# Patient Record
Sex: Male | Born: 2000 | Race: White | Hispanic: Yes | Marital: Single | Smoking: Never smoker
Health system: Southern US, Community
[De-identification: ages and names within clinical notes are randomized; demographics above are authoritative.]

## PROBLEM LIST (undated history)

## (undated) DIAGNOSIS — R7303 Prediabetes: Secondary | ICD-10-CM

## (undated) HISTORY — DX: Prediabetes: R73.03

---
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
## 2020-01-04 ENCOUNTER — Telehealth: Payer: Self-pay

## 2020-01-04 NOTE — Telephone Encounter (Signed)
Pre-screening for onsite visit  1. Who is bringing the patient to the visit? Self   Informed only one adult can bring patient to the visit to limit possible exposure to COVID19 and facemasks must be worn while in the building by the patient (ages 2 and older) and adult.  2. Has the person bringing the patient or the patient been around anyone with suspected or confirmed COVID-19 in the last 14 days? No   3. Has the person bringing the patient or the patient been around anyone who has been tested for COVID-19 in the last 14 days? no  4. Has the person bringing the patient or the patient had any of these symptoms in the last 14 days? No  Fever (temp 100 F or higher) Breathing problems Cough Sore throat Body aches Chills Vomiting Diarrhea Loss of taste or smell   If all answers are negative, advise patient to call our office prior to your appointment if you or the patient develop any of the symptoms listed above.   If any answers are yes, cancel in-office visit and schedule the patient for a same day telehealth visit with a provider to discuss the next steps.

## 2020-01-05 ENCOUNTER — Encounter: Payer: Self-pay | Admitting: Pediatrics

## 2020-01-05 ENCOUNTER — Other Ambulatory Visit (HOSPITAL_COMMUNITY)
Admission: RE | Admit: 2020-01-05 | Discharge: 2020-01-05 | Disposition: A | Discharge status: Home / Self Care | Payer: Medicaid Other | Source: Ambulatory Visit | Attending: Pediatrics | Admitting: Pediatrics

## 2020-01-05 ENCOUNTER — Other Ambulatory Visit: Payer: Self-pay

## 2020-01-05 ENCOUNTER — Ambulatory Visit (INDEPENDENT_AMBULATORY_CARE_PROVIDER_SITE_OTHER): Payer: Medicaid Other | Admitting: Pediatrics

## 2020-01-05 VITALS — BP 120/64 | HR 77 | Ht 69.69 in | Wt 220.8 lb

## 2020-01-05 DIAGNOSIS — Z6830 Body mass index (BMI) 30.0-30.9, adult: Secondary | ICD-10-CM | POA: Diagnosis not present

## 2020-01-05 DIAGNOSIS — Z113 Encounter for screening for infections with a predominantly sexual mode of transmission: Secondary | ICD-10-CM | POA: Diagnosis not present

## 2020-01-05 DIAGNOSIS — E661 Drug-induced obesity: Secondary | ICD-10-CM | POA: Diagnosis not present

## 2020-01-05 DIAGNOSIS — Z23 Encounter for immunization: Secondary | ICD-10-CM

## 2020-01-05 DIAGNOSIS — Z0001 Encounter for general adult medical examination with abnormal findings: Secondary | ICD-10-CM

## 2020-01-05 DIAGNOSIS — L309 Dermatitis, unspecified: Secondary | ICD-10-CM | POA: Diagnosis not present

## 2020-01-05 LAB — POCT RAPID HIV: Rapid HIV, POC: NEGATIVE

## 2020-01-05 MED ORDER — CLOBETASOL PROPIONATE 0.05 % EX OINT: 1.0000 "application " | TOPICAL_OINTMENT | Freq: Two times a day (BID) | CUTANEOUS | 5 refills | Status: DC

## 2020-01-05 NOTE — Patient Instructions (Signed)
As your medical provider, it is important to me that you continue to receive high-quality primary care services as you transition to adulthood.  After the age of 76, you can no longer be seen at the [facility] for your primary care health services.   Below is a list of adult medicine practices that are currently accepting new patients.    Adult Primary Care Clinics Name Criteria Services   [facility]  Address: [address] [place], [place] [address]  Phone: [phone] Hours: Monday - Friday 9 AM -6 PM  Types of insurance accepted:  Marland Kitchen Nurse, learning disability . [facility] (orange card) . Medicaid . Medicare . Uninsured  Language services:  Marland Kitchen Video and phone interpreters available   Ages 36 and older    . Adult primary care . Onsite pharmacy . Integrated behavioral health . Financial assistance counseling . Walk-in hours for established patients  Financial assistance counseling hours: Tuesdays 2:00PM - 5:00PM  Thursday 8:30AM - 4:30PM  Space is limited, 10 on Tuesday and 20 on Thursday on a first come, first serve basis  Name Criteria Services   [facility]  Address: [address] [place], [place] [address]  Phone: [phone]  Hours: Monday - Friday 8:30 AM - 5 PM  Types of insurance accepted:  Marland Kitchen Nurse, learning disability . Medicaid . Medicare . Uninsured  Language services:  Marland Kitchen Video and phone interpreters available   All ages - newborn to adult   . Primary care for all ages (children and adults) . Integrated behavioral health . Nutritionist . Financial assistance counseling   Name Criteria Services   [facility]  Located on the ground floor of [facility]  Address: [address]  [place],  [place]  [address]  Phone: [phone]  Hours: Monday - Friday 8:15 AM - 5 PM  Types of insurance  accepted:  Marland Kitchen Nurse, learning disability . Medicaid . Medicare . Uninsured  Language services:  Marland Kitchen Video and phone interpreters available   Ages 67 and older   . Adult primary care . Nutritionist . Certified Diabetes Educator  . Integrated behavioral health . Financial assistance counseling   Name Criteria Services   [facility]  Address: [address] [place], [place] [address]  Phone: [phone]  Hours: Monday - Friday 8:30 AM - 5 PM    Types of insurance accepted:  Marland Kitchen Nurse, learning disability . Medicaid . Medicare . Uninsured  Language services:  Marland Kitchen Video and phone interpreters available   All ages - newborn to adult   . Primary care for all ages (children and adults) . Integrated behavioral health . Financial assistance counseling

## 2020-01-05 NOTE — Progress Notes (Signed)
Adolescent Well Care Visit Allen Raymond is a 19 y.o. male who is here for well care.    PCP:  Clifton Custard, MD   History was provided by the patient.   Current Issues: Current concerns include eczema is flaring on his hands.  vaseline is helping a bit.  He ran out of his prescription cream which helped the eczema a lot.     Nutrition: Nutrition/Eating Behaviors: balanced diet, working on more fruits and veggies Adequate calcium in diet?: no Supplements/ Vitamins: no  Exercise/ Media: Play any Sports?/ Exercise: soccer - playing about 4 times per week, goalie usually. Screen Time:  > 2 hours-counseling provided  Sleep:  Sleep: staying up late, bedtime is 3-4 AM, wakes 7 AM for work, trying to fall asleep. No caffeine.   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  Wants to get GED - looking into online classes  Confidential Social History: Tobacco?  no Secondhand smoke exposure?  no Drugs/ETOH?  He reports smoking marijuana with friends a few times per month.  He says that it helps him relax and he likes it.  He is not thinking about stopping or reducing his use.  We discussed how using marijuana affects his health and his plans for his future.   Sexually Active?  yes - 1 male partner (his girlfriend)  Pregnancy Prevention: condoms  Screenings: Patient has a dental home: yes  The patient completed the Rapid Assessment for Adolescent Preventive Services screening questionnaire and the following topics were identified as risk factors and discussed:  Marijuana use.   In addition, the following topics were discussed as part of anticipatory guidance healthy eating, exercise, drug use, condom use and mental health issues.  PHQ-9 completed and results indicated no signs of depression  Physical Exam:   Vitals:   01/05/20 1437  BP: 120/64  Pulse: 77  SpO2: 97%  Weight: 220 lb 12.8 oz (100.2 kg)  Height: 5' 9.69" (1.77 m)   BP 120/64 (BP Location: Right Arm, Patient Position: Sitting, Cuff Size: Large)   Pulse 77   Ht 5' 9.69" (1.77 m)   Wt 220 lb 12.8 oz (100.2 kg)   SpO2 97%   BMI 31.97 kg/m  Body mass index: body mass index is 31.97 kg/m.    Hearing Screening   Method: Audiometry   125Hz  250Hz  500Hz  1000Hz  2000Hz  3000Hz  4000Hz  6000Hz  8000Hz   Right ear:   40 40 20  20    Left ear:   40 40 20  20      Visual Acuity Screening   Right eye Left eye Both eyes  Without correction: 20/20 20/20   With correction:       General Appearance:   alert, oriented, no acute distress and well nourished  HENT: Normocephalic, no obvious abnormality, conjunctiva clear  Mouth:   Normal appearing teeth, no obvious discoloration, dental caries, or dental caps  Neck:   Supple; thyroid: no enlargement, symmetric, no tenderness/mass/nodules  Chest Normal male  Lungs:   Clear to auscultation bilaterally, normal work of breathing  Heart:   Regular rate and rhythm, S1 and S2 normal, no murmurs;   Abdomen:   Soft, non-tender, no mass, or organomegaly  GU normal male genitals, no testicular masses or hernia, Tanner stage V, chaperone CMA) present during GU exam  Musculoskeletal:   Tone and strength  strong and symmetrical, all extremities               Lymphatic:   No cervical adenopathy  Skin/Hair/Nails:   no bruises or petechiae, rough dry patches with skin breakdown on the dorsum of hands and fingers.  No oozing or drainage  Neurologic:   Strength, gait, and coordination normal and age-appropriate     Assessment and Plan:   Encounter for general adult medical examination with abnormal findings  Routine screening for STI (sexually transmitted infection) - POCT Rapid HIV - negative - Urine cytology ancillary only  Eczema of hands No signs of superinfection.  Poorly controlled  without Rx.  Restart clobestasol prn. Discussed supportive care with hypoallergenic soap and regular application of bland emollients.  Reviewed appropriate use of steroid creams and return precautions. - clobetasol ointment (TEMOVATE) 0.05 %; Apply 1 application topically 2 (two) times daily. For very severe eczema.  Apply to affected area once a day until skin is soft and smooth.  Dispense: 30 g; Refill: 5  Hearing screening result:abnormal - denies hearing concerns at home.  Plan to recheck in 1 year Vision screening result: normal  Counseling provided for all of the vaccine components  Orders Placed This Encounter  Procedures  . Flu Vaccine QUAD 36+ mos IM     Return for 19 year old [facility] with Dr. Doneen Poisson in 1 year.Carmie End, MD

## 2020-01-08 ENCOUNTER — Encounter: Payer: Self-pay | Admitting: Pediatrics

## 2020-01-08 LAB — URINE CYTOLOGY ANCILLARY ONLY
Chlamydia: NEGATIVE
Comment: NEGATIVE
Comment: NORMAL
Neisseria Gonorrhea: NEGATIVE

## 2020-10-02 ENCOUNTER — Ambulatory Visit
Admission: RE | Admit: 2020-10-02 | Discharge: 2020-10-02 | Disposition: A | Discharge status: Home / Self Care | Payer: Medicaid Other | Source: Ambulatory Visit | Attending: Family Medicine | Admitting: Family Medicine

## 2020-10-02 ENCOUNTER — Ambulatory Visit (INDEPENDENT_AMBULATORY_CARE_PROVIDER_SITE_OTHER): Payer: Medicaid Other | Admitting: Family Medicine

## 2020-10-02 ENCOUNTER — Other Ambulatory Visit: Payer: Self-pay

## 2020-10-02 VITALS — BP 128/80 | Ht 71.0 in | Wt 220.0 lb

## 2020-10-02 DIAGNOSIS — M25561 Pain in right knee: Secondary | ICD-10-CM

## 2020-10-02 NOTE — Patient Instructions (Signed)
You have a medial meniscus tear in your knee. We will go ahead with an MRI to further assess - get x-rays after you leave today. If insurance denies this we'll put in an order for formal physical therapy. Ice the knee 15 minutes at a time 3-4 times a day. Knee brace when up and walking around. Aleve 1-2 tabs twice a day with food for pain and inflammation. Do quad strengthening exercises (knee extensions, straight leg raises) 3 sets of 10 once a day as we discussed. Follow up with me after the MRI for a no charge visit to go over results.

## 2020-10-02 NOTE — Progress Notes (Signed)
PCP: Clifton Custard, MD  Subjective:   HPI: Patient is a 18 y.o. male here for right knee pain for 1 month.  Patient reports that he was playing soccer approximately 1 month ago and was pushed from the left side.  His right foot was planted and he felt like his knee bent laterally.  He had immediate swelling and for the first 3 days was unable to walk on it.  He has since been improving but it has not been a month and he is still unable to play soccer and still having significant pain with swelling in his knee.  Patient reports that he has had meniscal tears in his right knee in the past and that he still has issues with feeling "popping" in his knees when he walks.  He has tried ice and heat with little relief.   Past Medical History:  Diagnosis Date  . Elevated blood pressure reading 12/06/2018  . Prediabetes 01/14/2016    Current Outpatient Medications on File Prior to Visit  Medication Sig Dispense Refill  . clobetasol ointment (TEMOVATE) 0.05 % Apply 1 application topically 2 (two) times daily. For very severe eczema.  Apply to affected area once a day until skin is soft and smooth. 30 g 5   No current facility-administered medications on file prior to visit.    No past surgical history on file.  No Known Allergies  Social History   Socioeconomic History  . Marital status: Single    Spouse name: Not on file  . Number of children: Not on file  . Years of education: Not on file  . Highest education level: Not on file  Occupational History  . Not on file  Tobacco Use  . Smoking status: Never Smoker  . Smokeless tobacco: Never Used  Substance and Sexual Activity  . Alcohol use: No  . Drug use: Not on file  . Sexual activity: Not on file  Other Topics Concern  . Not on file  Social History Narrative   Lives with parents, older brother and younger sister.  All are in school.  He is in the 7th grade.   Social Determinants of Health   Financial Resource Strain:   .  Difficulty of Paying Living Expenses: Not on file  Food Insecurity:   . Worried About Programme researcher, broadcasting/film/video in the Last Year: Not on file  . Ran Out of Food in the Last Year: Not on file  Transportation Needs:   . Lack of Transportation (Medical): Not on file  . Lack of Transportation (Non-Medical): Not on file  Physical Activity:   . Days of Exercise per Week: Not on file  . Minutes of Exercise per Session: Not on file  Stress:   . Feeling of Stress : Not on file  Social Connections:   . Frequency of Communication with Friends and Family: Not on file  . Frequency of Social Gatherings with Friends and Family: Not on file  . Attends Religious Services: Not on file  . Active Member of Clubs or Organizations: Not on file  . Attends Banker Meetings: Not on file  . Marital Status: Not on file  Intimate Partner Violence:   . Fear of Current or Ex-Partner: Not on file  . Emotionally Abused: Not on file  . Physically Abused: Not on file  . Sexually Abused: Not on file    Family History  Problem Relation Age of Onset  . Diabetes Maternal Grandmother  BP 128/80   Ht 5\' 11"  (1.803 m)   Wt 220 lb (99.8 kg)   BMI 30.68 kg/m   [facility] 10/02/2020  Frequency of aerobic exercise (# of days/week) 3  Average time in minutes 90  Frequency of strengthening activities (# of days/week) 0    No flowsheet data found.  Review of Systems: See HPI above.     Objective:  Physical Exam:  Gen: NAD, comfortable in exam room  Right knee exam: No gross deformity, ecchymoses.  Large joint effusion. Tenderness to palpation along the medial joint line as well as just medial to the patella. Lacks about 15 degrees of flexion.  Full extension. Negative ant/post drawers. Negative lachmans. Negative patellar apprehension.  Positive McMurray, apley, thessaly NV intact distally.  Assessment & Plan:  Right knee pain Patient with right knee pain for  approximately 1 month.  Injured knee while playing soccer.  Physical exam concerning for medial meniscal tear not improving over this time.  Difficulty ambulating due to pain.  Home exercises reviewed, knee brace -Knee x-ray ordered -MRI of the knee ordered -Knee brace provided -Recommended NSAIDs/Tylenol for pain relief -Follow-up after scans

## 2020-10-03 ENCOUNTER — Encounter: Payer: Self-pay | Admitting: Family Medicine

## 2020-10-05 ENCOUNTER — Ambulatory Visit
Admission: RE | Admit: 2020-10-05 | Discharge: 2020-10-05 | Disposition: A | Discharge status: Home / Self Care | Payer: Medicaid Other | Source: Ambulatory Visit | Attending: Family Medicine | Admitting: Family Medicine

## 2020-10-05 DIAGNOSIS — M25561 Pain in right knee: Secondary | ICD-10-CM | POA: Diagnosis not present

## 2020-10-11 DIAGNOSIS — M25561 Pain in right knee: Secondary | ICD-10-CM | POA: Diagnosis not present

## 2020-10-18 ENCOUNTER — Other Ambulatory Visit: Payer: Self-pay

## 2020-10-18 ENCOUNTER — Encounter (HOSPITAL_BASED_OUTPATIENT_CLINIC_OR_DEPARTMENT_OTHER): Payer: Self-pay | Admitting: Orthopaedic Surgery

## 2020-10-20 NOTE — H&P (Signed)
PREOPERATIVE H&P  Chief Complaint: RIGHT KNEE LATERAL MENISCUS TEAR  HPI: Allen Raymond is a 19 y.o. male who is scheduled for, Procedure(s): KNEE ARTHROSCOPY WITH LATERAL MENISECTOMY KNEE ARTHROSCOPY WITH MENISCAL REPAIR.   Patient is a healthy 19 year old who had an injury to his knee while he was playing soccer.  He had a twist knee injury with immediate pain and the inability to straighten his knee.  He has been unhappy with his knee since.  He was seen by Dr. Pearletha Forge and told he had a bucket handle lateral meniscus tear.    His symptoms are rated as moderate to severe, and have been worsening.  This is significantly impairing activities of daily living.    Please see clinic note for further details on this patient's care.    He has elected for surgical management.   Past Medical History:  Diagnosis Date  . Elevated blood pressure reading 12/06/2018  . Prediabetes 01/14/2016   History reviewed. No pertinent surgical history. Social History   Socioeconomic History  . Marital status: Single    Spouse name: Not on file  . Number of children: Not on file  . Years of education: Not on file  . Highest education level: Not on file  Occupational History  . Not on file  Tobacco Use  . Smoking status: Never Smoker  . Smokeless tobacco: Never Used  Vaping Use  . Vaping Use: Never used  Substance and Sexual Activity  . Alcohol use: No  . Drug use: Never  . Sexual activity: Yes  Other Topics Concern  . Not on file  Social History Narrative   Lives with parents, older brother and younger sister.  All are in school.  He is in the 7th grade.   Social Determinants of Health   Financial Resource Strain: Not on file  Food Insecurity: Not on file  Transportation Needs: Not on file  Physical Activity: Not on file  Stress: Not on file  Social Connections: Not on file   Family History  Problem Relation Age of Onset  . Diabetes Maternal Grandmother    No Known  Allergies Prior to Admission medications   Medication Sig Start Date End Date Taking? Authorizing Provider  clobetasol ointment (TEMOVATE) 0.05 % Apply 1 application topically 2 (two) times daily. For very severe eczema.  Apply to affected area once a day until skin is soft and smooth. 01/05/20  Yes Ettefagh, Aron Baba, MD    ROS: All other systems have been reviewed and were otherwise negative with the exception of those mentioned in the HPI and as above.  Physical Exam: General: Alert, no acute distress Cardiovascular: No pedal edema Respiratory: No cyanosis, no use of accessory musculature GI: No organomegaly, abdomen is soft and non-tender Skin: No lesions in the area of chief complaint Neurologic: Sensation intact distally Psychiatric: Patient is competent for consent with normal mood and affect Lymphatic: No axillary or cervical lymphadenopathy  MUSCULOSKELETAL:  Right knee: He is tender to palpation at the lateral joint line.    He has pain with deep flexion, and can only get to about 90 degrees of flexion.  He is ligamentously normal, and he is otherwise relatively normal.  Imaging: MRI is reviewed which demonstrates partial thickness lateral cartilage loss as well as a bucket handle lateral meniscus tear.  The ligaments appear to be intact.  Assessment: Lateral bucket handle locked meniscus tear.   Plan: Plan for Procedure(s): KNEE ARTHROSCOPY WITH LATERAL MENISECTOMY KNEE ARTHROSCOPY  WITH MENISCAL REPAIR  The risks benefits and alternatives were discussed with the patient including but not limited to the risks of nonoperative treatment, versus surgical intervention including infection, bleeding, nerve injury,  blood clots, cardiopulmonary complications, morbidity, mortality, among others, and they were willing to proceed.   The patient acknowledged the explanation, agreed to proceed with the plan and consent was signed.   Operative Plan: Right knee arthroscopy with  meniscectomy versus a repair Discharge Medications: Standard DVT Prophylaxis: Aspirin Physical Therapy: +/- Special Discharge needs: +/- knee immobilizer   Vernetta Honey, PA-C  10/20/2020 3:32 PM

## 2020-10-21 ENCOUNTER — Inpatient Hospital Stay (HOSPITAL_COMMUNITY): Admission: RE | Admit: 2020-10-21 | Payer: Medicaid Other | Source: Ambulatory Visit

## 2020-10-22 ENCOUNTER — Other Ambulatory Visit (HOSPITAL_COMMUNITY)
Admission: RE | Admit: 2020-10-22 | Discharge: 2020-10-22 | Disposition: A | Discharge status: Home / Self Care | Payer: Medicaid Other | Source: Ambulatory Visit | Attending: Orthopaedic Surgery | Admitting: Orthopaedic Surgery

## 2020-10-22 DIAGNOSIS — Z20822 Contact with and (suspected) exposure to covid-19: Secondary | ICD-10-CM | POA: Insufficient documentation

## 2020-10-22 DIAGNOSIS — Z01812 Encounter for preprocedural laboratory examination: Secondary | ICD-10-CM | POA: Diagnosis not present

## 2020-10-22 LAB — SARS CORONAVIRUS 2 (TAT 6-24 HRS): SARS Coronavirus 2: NEGATIVE

## 2020-10-24 ENCOUNTER — Ambulatory Visit (HOSPITAL_BASED_OUTPATIENT_CLINIC_OR_DEPARTMENT_OTHER)
Admission: RE | Admit: 2020-10-24 | Discharge: 2020-10-24 | Disposition: A | Discharge status: Home / Self Care | Payer: Medicaid Other | Attending: Orthopaedic Surgery | Admitting: Orthopaedic Surgery

## 2020-10-24 ENCOUNTER — Encounter (HOSPITAL_BASED_OUTPATIENT_CLINIC_OR_DEPARTMENT_OTHER)
Admission: RE | Disposition: A | Discharge status: Home / Self Care | Payer: Self-pay | Source: Home / Self Care | Attending: Orthopaedic Surgery

## 2020-10-24 ENCOUNTER — Other Ambulatory Visit: Payer: Self-pay

## 2020-10-24 ENCOUNTER — Ambulatory Visit (HOSPITAL_BASED_OUTPATIENT_CLINIC_OR_DEPARTMENT_OTHER): Payer: Medicaid Other | Admitting: Anesthesiology

## 2020-10-24 ENCOUNTER — Encounter (HOSPITAL_BASED_OUTPATIENT_CLINIC_OR_DEPARTMENT_OTHER): Payer: Self-pay | Admitting: Orthopaedic Surgery

## 2020-10-24 DIAGNOSIS — Y9389 Activity, other specified: Secondary | ICD-10-CM | POA: Diagnosis not present

## 2020-10-24 DIAGNOSIS — Y9366 Activity, soccer: Secondary | ICD-10-CM | POA: Diagnosis not present

## 2020-10-24 DIAGNOSIS — Z833 Family history of diabetes mellitus: Secondary | ICD-10-CM | POA: Diagnosis not present

## 2020-10-24 DIAGNOSIS — L700 Acne vulgaris: Secondary | ICD-10-CM | POA: Diagnosis not present

## 2020-10-24 DIAGNOSIS — S83281A Other tear of lateral meniscus, current injury, right knee, initial encounter: Secondary | ICD-10-CM | POA: Diagnosis not present

## 2020-10-24 DIAGNOSIS — S83271A Complex tear of lateral meniscus, current injury, right knee, initial encounter: Secondary | ICD-10-CM | POA: Diagnosis not present

## 2020-10-24 DIAGNOSIS — R7303 Prediabetes: Secondary | ICD-10-CM | POA: Diagnosis not present

## 2020-10-24 DIAGNOSIS — L309 Dermatitis, unspecified: Secondary | ICD-10-CM | POA: Diagnosis not present

## 2020-10-24 HISTORY — PX: KNEE ARTHROSCOPY WITH LATERAL MENISECTOMY: SHX6193

## 2020-10-24 SURGERY — ARTHROSCOPY, KNEE, WITH LATERAL MENISCECTOMY
Anesthesia: General | Site: Knee | Laterality: Right

## 2020-10-24 MED ORDER — BUPIVACAINE HCL (PF) 0.25 % IJ SOLN
INTRAMUSCULAR | Status: DC | PRN
  Administered 2020-10-24: 20 mL

## 2020-10-24 MED ORDER — OXYCODONE HCL 5 MG/5ML PO SOLN: 5.0000 mg | Freq: Once | ORAL | Status: AC | PRN

## 2020-10-24 MED ORDER — FENTANYL CITRATE (PF) 100 MCG/2ML IJ SOLN
INTRAMUSCULAR | Status: AC
  Filled 2020-10-24: qty 2

## 2020-10-24 MED ORDER — FENTANYL CITRATE (PF) 100 MCG/2ML IJ SOLN: 25.0000 ug | INTRAMUSCULAR | Status: DC | PRN

## 2020-10-24 MED ORDER — CEFAZOLIN SODIUM-DEXTROSE 2-4 GM/100ML-% IV SOLN
INTRAVENOUS | Status: AC
  Filled 2020-10-24: qty 100

## 2020-10-24 MED ORDER — ACETAMINOPHEN 500 MG PO TABS
ORAL_TABLET | ORAL | Status: AC
  Filled 2020-10-24: qty 2

## 2020-10-24 MED ORDER — ASPIRIN 81 MG PO CHEW: 81.0000 mg | CHEWABLE_TABLET | Freq: Two times a day (BID) | ORAL | 0 refills | Status: AC

## 2020-10-24 MED ORDER — FENTANYL CITRATE (PF) 100 MCG/2ML IJ SOLN
INTRAMUSCULAR | Status: DC | PRN
  Administered 2020-10-24: 50 ug via INTRAVENOUS
  Administered 2020-10-24 (×4): 25 ug via INTRAVENOUS

## 2020-10-24 MED ORDER — KETOROLAC TROMETHAMINE 30 MG/ML IJ SOLN
INTRAMUSCULAR | Status: DC | PRN
  Administered 2020-10-24: 30 mg via INTRAVENOUS

## 2020-10-24 MED ORDER — CEFAZOLIN SODIUM-DEXTROSE 2-4 GM/100ML-% IV SOLN
2.0000 g | INTRAVENOUS | Status: AC
  Administered 2020-10-24: 11:00:00 2 g via INTRAVENOUS

## 2020-10-24 MED ORDER — ONDANSETRON HCL 4 MG PO TABS: 4.0000 mg | ORAL_TABLET | Freq: Every day | ORAL | 0 refills | Status: DC | PRN

## 2020-10-24 MED ORDER — OXYCODONE HCL 5 MG PO TABS: 5.0000 mg | ORAL_TABLET | Freq: Four times a day (QID) | ORAL | 0 refills | Status: DC | PRN

## 2020-10-24 MED ORDER — LACTATED RINGERS IV SOLN: INTRAVENOUS | Status: DC

## 2020-10-24 MED ORDER — PROMETHAZINE HCL 25 MG/ML IJ SOLN: 6.2500 mg | INTRAMUSCULAR | Status: DC | PRN

## 2020-10-24 MED ORDER — MIDAZOLAM HCL 2 MG/2ML IJ SOLN
INTRAMUSCULAR | Status: AC
  Filled 2020-10-24: qty 2

## 2020-10-24 MED ORDER — OXYCODONE HCL 5 MG PO TABS
ORAL_TABLET | ORAL | Status: AC
  Filled 2020-10-24: qty 1

## 2020-10-24 MED ORDER — ACETAMINOPHEN 500 MG PO TABS: 1000.0000 mg | ORAL_TABLET | Freq: Three times a day (TID) | ORAL | 0 refills | Status: AC

## 2020-10-24 MED ORDER — ONDANSETRON HCL 4 MG/2ML IJ SOLN
INTRAMUSCULAR | Status: AC
  Filled 2020-10-24: qty 2

## 2020-10-24 MED ORDER — MELOXICAM 7.5 MG PO TABS: 7.5000 mg | ORAL_TABLET | Freq: Every day | ORAL | 0 refills | Status: AC

## 2020-10-24 MED ORDER — PROPOFOL 10 MG/ML IV BOLUS
INTRAVENOUS | Status: AC
  Filled 2020-10-24: qty 20

## 2020-10-24 MED ORDER — DEXAMETHASONE SODIUM PHOSPHATE 10 MG/ML IJ SOLN
INTRAMUSCULAR | Status: DC | PRN
  Administered 2020-10-24: 10 mg via INTRAVENOUS

## 2020-10-24 MED ORDER — LIDOCAINE 2% (20 MG/ML) 5 ML SYRINGE
INTRAMUSCULAR | Status: DC | PRN
  Administered 2020-10-24: 100 mg via INTRAVENOUS

## 2020-10-24 MED ORDER — ONDANSETRON HCL 4 MG/2ML IJ SOLN
INTRAMUSCULAR | Status: DC | PRN
  Administered 2020-10-24: 4 mg via INTRAVENOUS

## 2020-10-24 MED ORDER — PROPOFOL 10 MG/ML IV BOLUS
INTRAVENOUS | Status: DC | PRN
  Administered 2020-10-24: 200 mg via INTRAVENOUS

## 2020-10-24 MED ORDER — MIDAZOLAM HCL 5 MG/5ML IJ SOLN
INTRAMUSCULAR | Status: DC | PRN
  Administered 2020-10-24: 2 mg via INTRAVENOUS

## 2020-10-24 MED ORDER — ACETAMINOPHEN 500 MG PO TABS
1000.0000 mg | ORAL_TABLET | Freq: Once | ORAL | Status: AC
  Administered 2020-10-24: 10:00:00 1000 mg via ORAL

## 2020-10-24 MED ORDER — OXYCODONE HCL 5 MG PO TABS
5.0000 mg | ORAL_TABLET | Freq: Once | ORAL | Status: AC | PRN
  Administered 2020-10-24: 5 mg via ORAL

## 2020-10-24 SURGICAL SUPPLY — 45 items
BANDAGE ESMARK 6X9 LF (GAUZE/BANDAGES/DRESSINGS) IMPLANT
BLADE CLIPPER SURG (BLADE) IMPLANT
BLADE EXCALIBUR 4.0X13 (MISCELLANEOUS) ×2 IMPLANT
BLADE SHAVER BONE 5.0X13 (MISCELLANEOUS) IMPLANT
BNDG ELASTIC 6X5.8 VLCR STR LF (GAUZE/BANDAGES/DRESSINGS) ×2 IMPLANT
BNDG ESMARK 6X9 LF (GAUZE/BANDAGES/DRESSINGS)
BURR OVAL 8 FLU 4.0X13 (MISCELLANEOUS) IMPLANT
CHLORAPREP W/TINT 26 (MISCELLANEOUS) ×2 IMPLANT
CLSR STERI-STRIP ANTIMIC 1/2X4 (GAUZE/BANDAGES/DRESSINGS) ×2 IMPLANT
CUFF TOURN SGL QUICK 34 (TOURNIQUET CUFF) ×2
CUFF TRNQT CYL 34X4.125X (TOURNIQUET CUFF) ×1 IMPLANT
CUTTER TENSIONER SUT 2-0 0 FBW (INSTRUMENTS) IMPLANT
DISSECTOR 3.5MM X 13CM CVD (MISCELLANEOUS) IMPLANT
DISSECTOR 4.0MMX13CM CVD (MISCELLANEOUS) ×2 IMPLANT
DRAPE ARTHROSCOPY W/POUCH 90 (DRAPES) ×2 IMPLANT
DRAPE IMP U-DRAPE 54X76 (DRAPES) ×2 IMPLANT
DRAPE U-SHAPE 47X51 STRL (DRAPES) ×2 IMPLANT
FIBERSTICK 2 (SUTURE) IMPLANT
GAUZE SPONGE 4X4 12PLY STRL (GAUZE/BANDAGES/DRESSINGS) ×2 IMPLANT
GLOVE BIO SURGEON STRL SZ 6.5 (GLOVE) ×2 IMPLANT
GLOVE ECLIPSE 6.5 STRL STRAW (GLOVE) ×2 IMPLANT
GLOVE ECLIPSE 8.0 STRL XLNG CF (GLOVE) ×4 IMPLANT
GLOVE SRG 8 PF TXTR STRL LF DI (GLOVE) ×1 IMPLANT
GLOVE SURG UNDER POLY LF SZ6.5 (GLOVE) ×2 IMPLANT
GLOVE SURG UNDER POLY LF SZ7 (GLOVE) ×4 IMPLANT
GLOVE SURG UNDER POLY LF SZ8 (GLOVE) ×2
GOWN STRL REUS W/ TWL LRG LVL3 (GOWN DISPOSABLE) ×2 IMPLANT
GOWN STRL REUS W/TWL LRG LVL3 (GOWN DISPOSABLE) ×4
GOWN STRL REUS W/TWL XL LVL3 (GOWN DISPOSABLE) ×2 IMPLANT
KIT TURNOVER KIT B (KITS) ×2 IMPLANT
MANIFOLD NEPTUNE II (INSTRUMENTS) IMPLANT
NDL SAFETY ECLIPSE 18X1.5 (NEEDLE) ×1 IMPLANT
NEEDLE HYPO 18GX1.5 SHARP (NEEDLE) ×2
NS IRRIG 1000ML POUR BTL (IV SOLUTION) IMPLANT
PACK ARTHROSCOPY DSU (CUSTOM PROCEDURE TRAY) ×2 IMPLANT
PADDING CAST COTTON 6X4 STRL (CAST SUPPLIES) IMPLANT
PORT APPOLLO RF 90DEGREE MULTI (SURGICAL WAND) IMPLANT
SLEEVE SCD COMPRESS KNEE MED (MISCELLANEOUS) IMPLANT
SUT MNCRL AB 4-0 PS2 18 (SUTURE) ×2 IMPLANT
SYR 5ML LL (SYRINGE) ×2 IMPLANT
SYR 5ML LUER SLIP (SYRINGE) ×2 IMPLANT
TOWEL GREEN STERILE FF (TOWEL DISPOSABLE) ×4 IMPLANT
TUBE CONNECTING 20X1/4 (TUBING) ×2 IMPLANT
TUBING ARTHROSCOPY IRRIG 16FT (MISCELLANEOUS) ×2 IMPLANT
WATER STERILE IRR 1000ML POUR (IV SOLUTION) ×2 IMPLANT

## 2020-10-24 NOTE — Transfer of Care (Signed)
Immediate Anesthesia Transfer of Care Note  Patient: Allen Raymond  Procedure(s) Performed: KNEE ARTHROSCOPY WITH LATERAL MENISECTOMY (Right Knee)  Patient Location: PACU  Anesthesia Type:General and Regional  Level of Consciousness: awake, alert  and oriented  Airway & Oxygen Therapy: Patient Spontanous Breathing and Patient connected to face mask oxygen  Post-op Assessment: Report given to RN and Post -op Vital signs reviewed and stable  Post vital signs: Reviewed and stable  Last Vitals:  Vitals Value Taken Time  BP    Temp    Pulse    Resp    SpO2      Last Pain:  Vitals:   10/24/20 0925  TempSrc: Oral  PainSc: 0-No pain      Patients Stated Pain Goal: 7 (10/24/20 0925)  Complications: No complications documented.

## 2020-10-24 NOTE — Op Note (Signed)
Orthopaedic Surgery Operative Note (CSN: [id number])  Allen Raymond  October 15, 2001 Date of Surgery: 10/24/2020   Diagnoses:  RIGHT KNEE LATERAL MENISCUS TEAR  Procedure: Right partial lateral meniscectomy   Operative Finding Exam under anesthesia: Full motion no limitation ACL intact Suprapatellar pouch: Normal Patellofemoral Compartment: Normal Medial Compartment: Normal Lateral Compartment: Complex tear of the posterior lateral meniscus involving the almost the entirety of the radial fibers, parrot-beak component as well.  This was debrided back and fortunately there were no residual radial fibers and we had to remove all of the meniscus around the popliteal hiatus.  50% total meniscal volume was resected though the majority of this was right at the popliteal hiatus.  Intercondylar Notch: Normal  Successful completion of the planned procedure.  Unfortunately patient's meniscus was not able to be repaired and he is at high risk for post meniscectomy syndrome and lateral compartment arthritis.  He already has grade 1 and 2 softening of the lateral femoral condyle consistent with having overload potentially.  Post-operative plan: The patient will be WBAT.  The patient will be dc home.  DVT prophylaxis Aspirin 81 mg twice daily for 6 weeks.  Pain control with PRN pain medication preferring oral medicines.  Follow up plan will be scheduled in approximately 7 days for incision check.  Post-Op Diagnosis: Same Surgeons:Primary: Bjorn Pippin, MD Assistants:Caroline McBane PA-C Location: [facility] OR ROOM 6 Anesthesia: General with local Antibiotics: Ancef 2 g Tourniquet time:  Total Tourniquet Time Documented: Thigh (Right) - 13 minutes Total: Thigh (Right) - 13 minutes  Estimated Blood Loss: Minimal Complications: None Specimens: None Implants: * No implants in log *  Indications for Surgery:   Allen Raymond is a 19 y.o. male with lateral meniscus injury and mechanical  symptoms.  Benefits and risks of operative and nonoperative management were discussed prior to surgery with patient/guardian(s) and informed consent form was completed.  Specific risks including infection, need for additional surgery, post meniscectomy syndrome   Procedure:   The patient was identified properly. Informed consent was obtained and the surgical site was marked. The patient was taken up to suite where general anesthesia was induced. The patient was placed in the supine position with a post against the surgical leg and a nonsterile tourniquet applied. The surgical leg was then prepped and draped usual sterile fashion.  A standard surgical timeout was performed.  2 standard anterior portals were made and diagnostic arthroscopy performed. Please note the findings as noted above.  Lateral meniscus was complex in its nature and the tear and was not amenable to repair.  Resected back with a basket and shaver to a stable base.  There is complete transection of the meniscus at the level of the popliteal hiatus due to the complexity of the tear.  Incisions closed with absorbable suture. The patient was awoken from general anesthesia and taken to the PACU in stable condition without complication.   Alfonse Alpers, PA-C, present and scrubbed throughout the case, critical for completion in a timely fashion, and for retraction, instrumentation, closure.

## 2020-10-24 NOTE — Anesthesia Postprocedure Evaluation (Signed)
Anesthesia Post Note  Patient: Allen Raymond  Procedure(s) Performed: KNEE ARTHROSCOPY WITH LATERAL MENISECTOMY (Right Knee)     Patient location during evaluation: PACU Anesthesia Type: General Level of consciousness: awake and alert and oriented Pain management: pain level controlled Vital Signs Assessment: post-procedure vital signs reviewed and stable Respiratory status: spontaneous breathing, nonlabored ventilation and respiratory function stable Cardiovascular status: blood pressure returned to baseline Postop Assessment: no apparent nausea or vomiting Anesthetic complications: no   No complications documented.  Last Vitals:  Vitals:   10/24/20 1215 10/24/20 1235  BP: (!) 155/93 (!) 138/99  Pulse: 73 71  Resp: 15 16  Temp:  36.6 C  SpO2: 100% 100%    Last Pain:  Vitals:   10/24/20 1235  TempSrc:   PainSc: 3                  Kaylyn Layer

## 2020-10-24 NOTE — Anesthesia Preprocedure Evaluation (Addendum)
Anesthesia Evaluation  Patient identified by MRN, date of birth, ID band Patient awake    Reviewed: Allergy & Precautions, NPO status , Patient's Chart, lab work & pertinent test results  History of Anesthesia Complications Negative for: history of anesthetic complications  Airway Mallampati: II  TM Distance: >3 FB Neck ROM: Full    Dental no notable dental hx.    Pulmonary neg pulmonary ROS,    Pulmonary exam normal        Cardiovascular negative cardio ROS Normal cardiovascular exam     Neuro/Psych negative neurological ROS  negative psych ROS   GI/Hepatic negative GI ROS, Neg liver ROS,   Endo/Other  negative endocrine ROS  Renal/GU negative Renal ROS  negative genitourinary   Musculoskeletal negative musculoskeletal ROS (+)   Abdominal   Peds negative pediatric ROS (+)  Hematology negative hematology ROS (+)   Anesthesia Other Findings Day of surgery medications reviewed with patient.  Reproductive/Obstetrics negative OB ROS                            Anesthesia Physical Anesthesia Plan  ASA: II  Anesthesia Plan: General   Post-op Pain Management:    Induction: Intravenous  PONV Risk Score and Plan: Treatment may vary due to age or medical condition, Ondansetron, Dexamethasone and Midazolam  Airway Management Planned: LMA  Additional Equipment: None  Intra-op Plan:   Post-operative Plan: Extubation in OR  Informed Consent: I have reviewed the patients History and Physical, chart, labs and discussed the procedure including the risks, benefits and alternatives for the proposed anesthesia with the patient or authorized representative who has indicated his/her understanding and acceptance.     Dental advisory given  Plan Discussed with: CRNA  Anesthesia Plan Comments:        Anesthesia Quick Evaluation

## 2020-10-24 NOTE — Discharge Instructions (Signed)
Next dose of Tylenol can be given at 4:15pm if needed. Next dose of NSAIDS (Ibuprofen/Motrin/Aleve/Celebrex) can be given at 5:45pm if needed. Next dose of Oxycodone can be given at 6:30pm if needed.   Post Anesthesia Home Care Instructions  Activity: Get plenty of rest for the remainder of the day. A responsible individual must stay with you for 24 hours following the procedure.  For the next 24 hours, DO NOT: -Drive a car -Advertising copywriter -Drink alcoholic beverages -Take any medication unless instructed by your physician -Make any legal decisions or sign important papers.  Meals: Start with liquid foods such as gelatin or soup. Progress to regular foods as tolerated. Avoid greasy, spicy, heavy foods. If nausea and/or vomiting occur, drink only clear liquids until the nausea and/or vomiting subsides. Call your physician if vomiting continues.  Special Instructions/Symptoms: Your throat may feel dry or sore from the anesthesia or the breathing tube placed in your throat during surgery. If this causes discomfort, gargle with warm salt water. The discomfort should disappear within 24 hours.  If you had a scopolamine patch placed behind your ear for the management of post- operative nausea and/or vomiting:  1. The medication in the patch is effective for 72 hours, after which it should be removed.  Wrap patch in a tissue and discard in the trash. Wash hands thoroughly with soap and water. 2. You may remove the patch earlier than 72 hours if you experience unpleasant side effects which may include dry mouth, dizziness or visual disturbances. 3. Avoid touching the patch. Wash your hands with soap and water after contact with the patch.

## 2020-10-24 NOTE — Anesthesia Procedure Notes (Signed)
Procedure Name: LMA Insertion Date/Time: 10/24/2020 11:09 AM Performed by: Thornell Mule, CRNA Pre-anesthesia Checklist: Patient identified, Emergency Drugs available, Suction available and Patient being monitored Patient Re-evaluated:Patient Re-evaluated prior to induction Oxygen Delivery Method: Circle system utilized Preoxygenation: Pre-oxygenation with 100% oxygen Induction Type: IV induction LMA: LMA inserted LMA Size: 4.0 Number of attempts: 1 Placement Confirmation: positive ETCO2 Tube secured with: Tape Dental Injury: Teeth and Oropharynx as per pre-operative assessment

## 2020-10-24 NOTE — Interval H&P Note (Signed)
History and Physical Interval Note:  10/24/2020 10:30 AM  Allen Raymond  has presented today for surgery, with the diagnosis of RIGHT KNEE LATERAL MENISCUS TEAR.  The various methods of treatment have been discussed with the patient and family. After consideration of risks, benefits and other options for treatment, the patient has consented to  Procedure(s): KNEE ARTHROSCOPY WITH LATERAL MENISECTOMY (Right) KNEE ARTHROSCOPY WITH MENISCAL REPAIR (Right) as a surgical intervention.  The patient's history has been reviewed, patient examined, no change in status, stable for surgery.  I have reviewed the patient's chart and labs.  Questions were answered to the patient's satisfaction.     Bjorn Pippin

## 2020-10-28 ENCOUNTER — Encounter (HOSPITAL_BASED_OUTPATIENT_CLINIC_OR_DEPARTMENT_OTHER): Payer: Self-pay | Admitting: Orthopaedic Surgery

## 2020-11-04 DIAGNOSIS — S83281D Other tear of lateral meniscus, current injury, right knee, subsequent encounter: Secondary | ICD-10-CM | POA: Diagnosis not present

## 2020-11-05 ENCOUNTER — Encounter (HOSPITAL_BASED_OUTPATIENT_CLINIC_OR_DEPARTMENT_OTHER): Payer: Self-pay | Admitting: Orthopaedic Surgery

## 2020-11-07 DIAGNOSIS — M25661 Stiffness of right knee, not elsewhere classified: Secondary | ICD-10-CM | POA: Diagnosis not present

## 2020-11-07 DIAGNOSIS — M6281 Muscle weakness (generalized): Secondary | ICD-10-CM | POA: Diagnosis not present

## 2020-11-07 DIAGNOSIS — S83281D Other tear of lateral meniscus, current injury, right knee, subsequent encounter: Secondary | ICD-10-CM | POA: Diagnosis not present

## 2020-11-07 DIAGNOSIS — M25561 Pain in right knee: Secondary | ICD-10-CM | POA: Diagnosis not present

## 2020-11-19 DIAGNOSIS — M25661 Stiffness of right knee, not elsewhere classified: Secondary | ICD-10-CM | POA: Diagnosis not present

## 2020-11-19 DIAGNOSIS — M25561 Pain in right knee: Secondary | ICD-10-CM | POA: Diagnosis not present

## 2020-11-19 DIAGNOSIS — M6281 Muscle weakness (generalized): Secondary | ICD-10-CM | POA: Diagnosis not present

## 2020-11-19 DIAGNOSIS — S83281D Other tear of lateral meniscus, current injury, right knee, subsequent encounter: Secondary | ICD-10-CM | POA: Diagnosis not present

## 2020-12-10 DIAGNOSIS — M25561 Pain in right knee: Secondary | ICD-10-CM | POA: Diagnosis not present

## 2020-12-12 DIAGNOSIS — M25561 Pain in right knee: Secondary | ICD-10-CM | POA: Diagnosis not present

## 2020-12-12 DIAGNOSIS — M25661 Stiffness of right knee, not elsewhere classified: Secondary | ICD-10-CM | POA: Diagnosis not present

## 2020-12-12 DIAGNOSIS — M6281 Muscle weakness (generalized): Secondary | ICD-10-CM | POA: Diagnosis not present

## 2020-12-12 DIAGNOSIS — S83281D Other tear of lateral meniscus, current injury, right knee, subsequent encounter: Secondary | ICD-10-CM | POA: Diagnosis not present

## 2022-07-07 IMAGING — DX DG KNEE AP/LAT W/ SUNRISE*R*
3 series · 3 of 3 positions shown · non-contrast
Comparison: 04/14/2017

CLINICAL DATA: Acute right knee pain.

EXAM:
RIGHT KNEE 3 VIEWS

[dg knee ap/lat w/ sunrise right (1 of 3)]
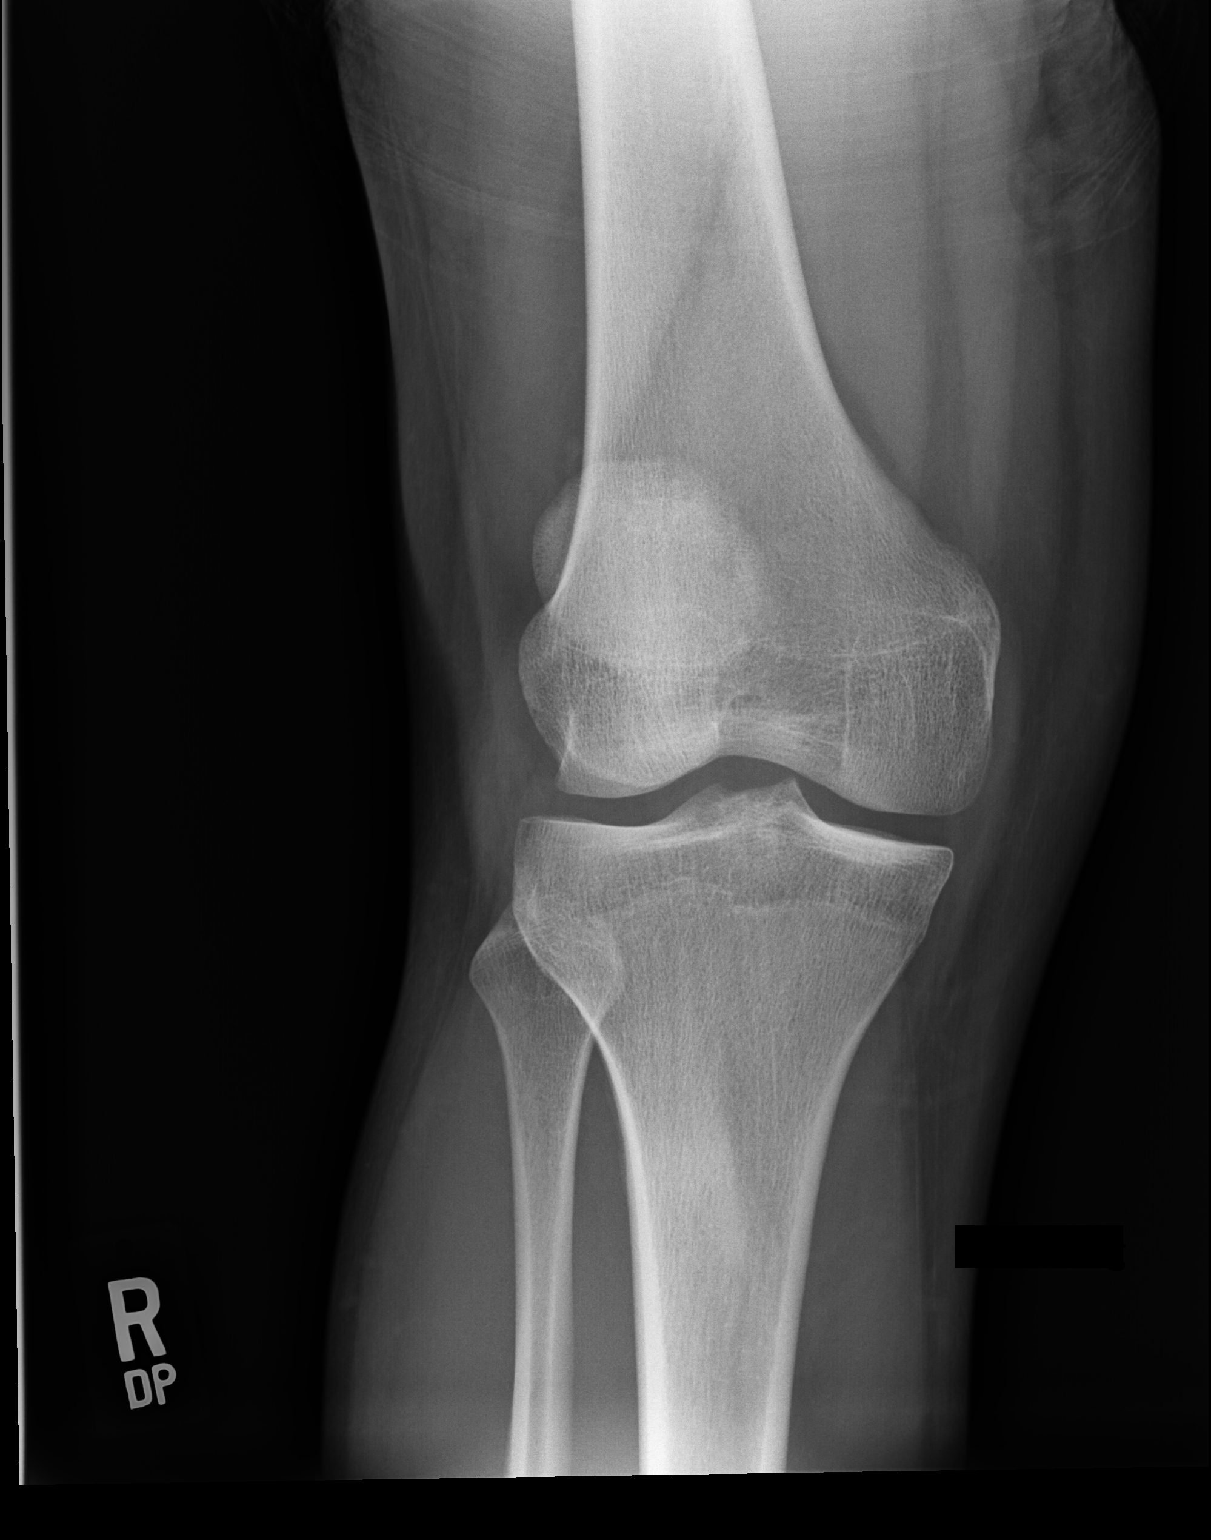

[dg knee ap/lat w/ sunrise right (2 of 3)]
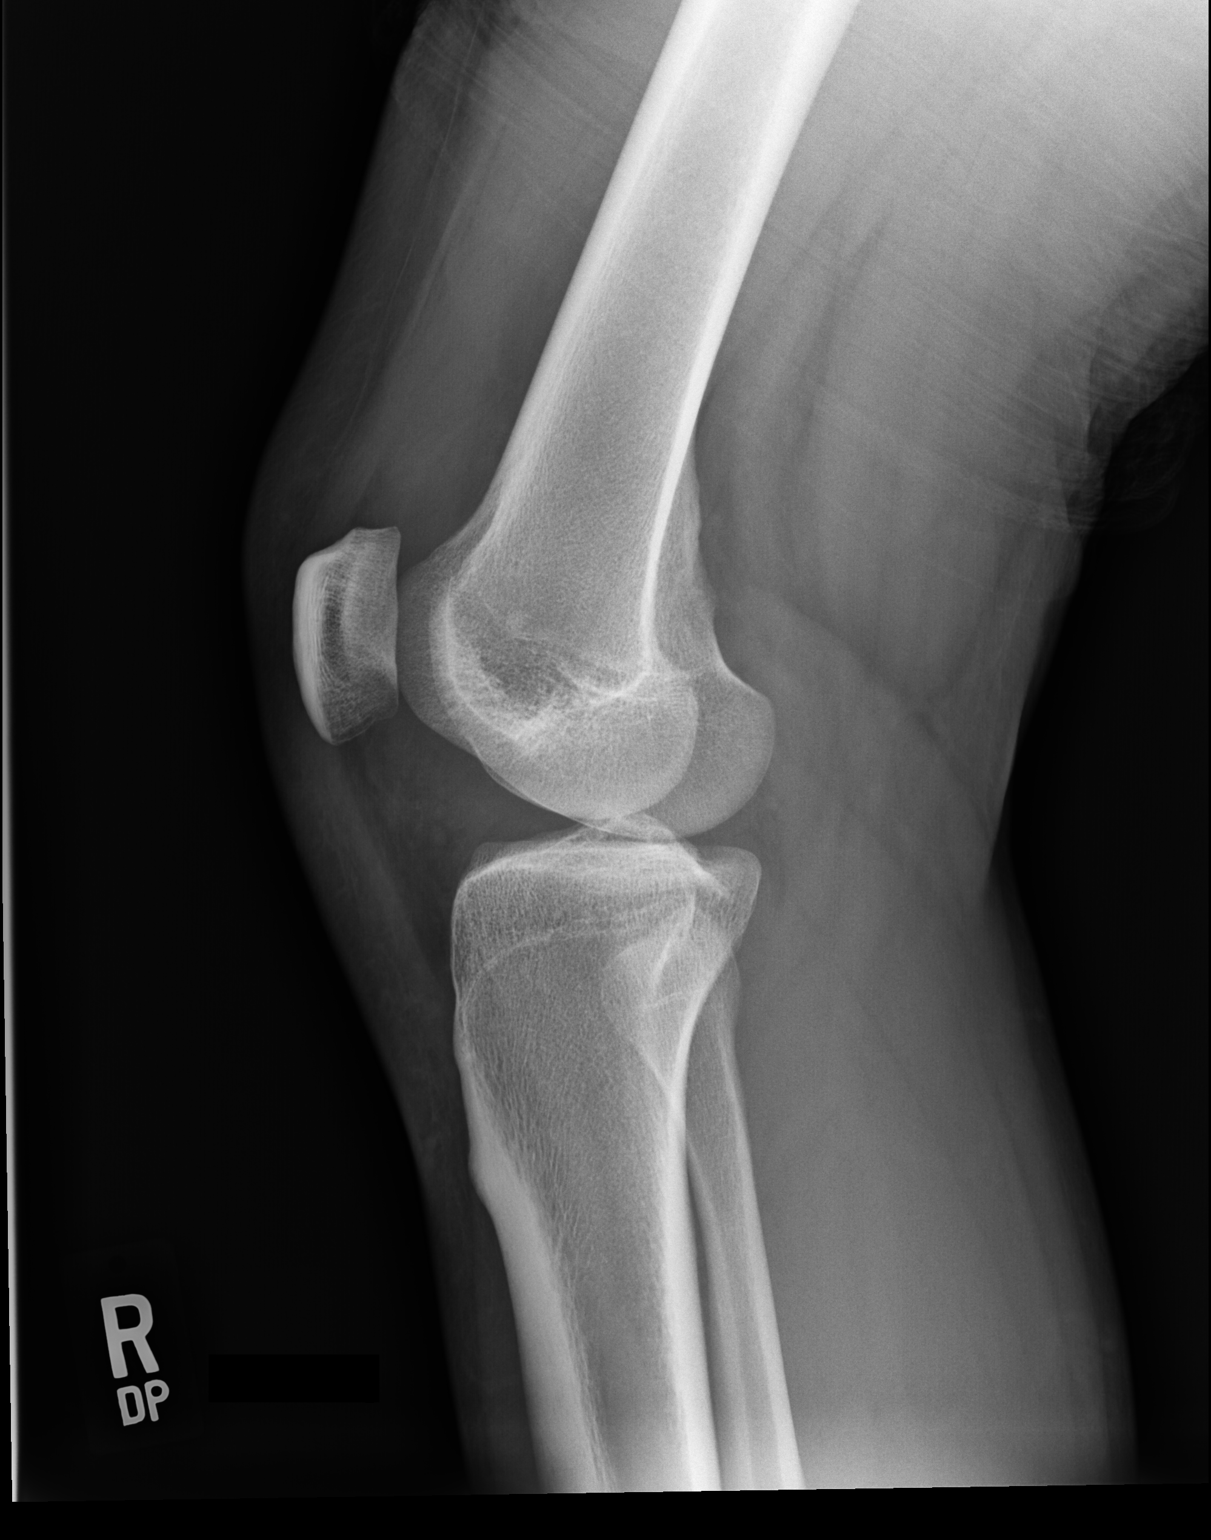

[dg knee ap/lat w/ sunrise right (3 of 3)]
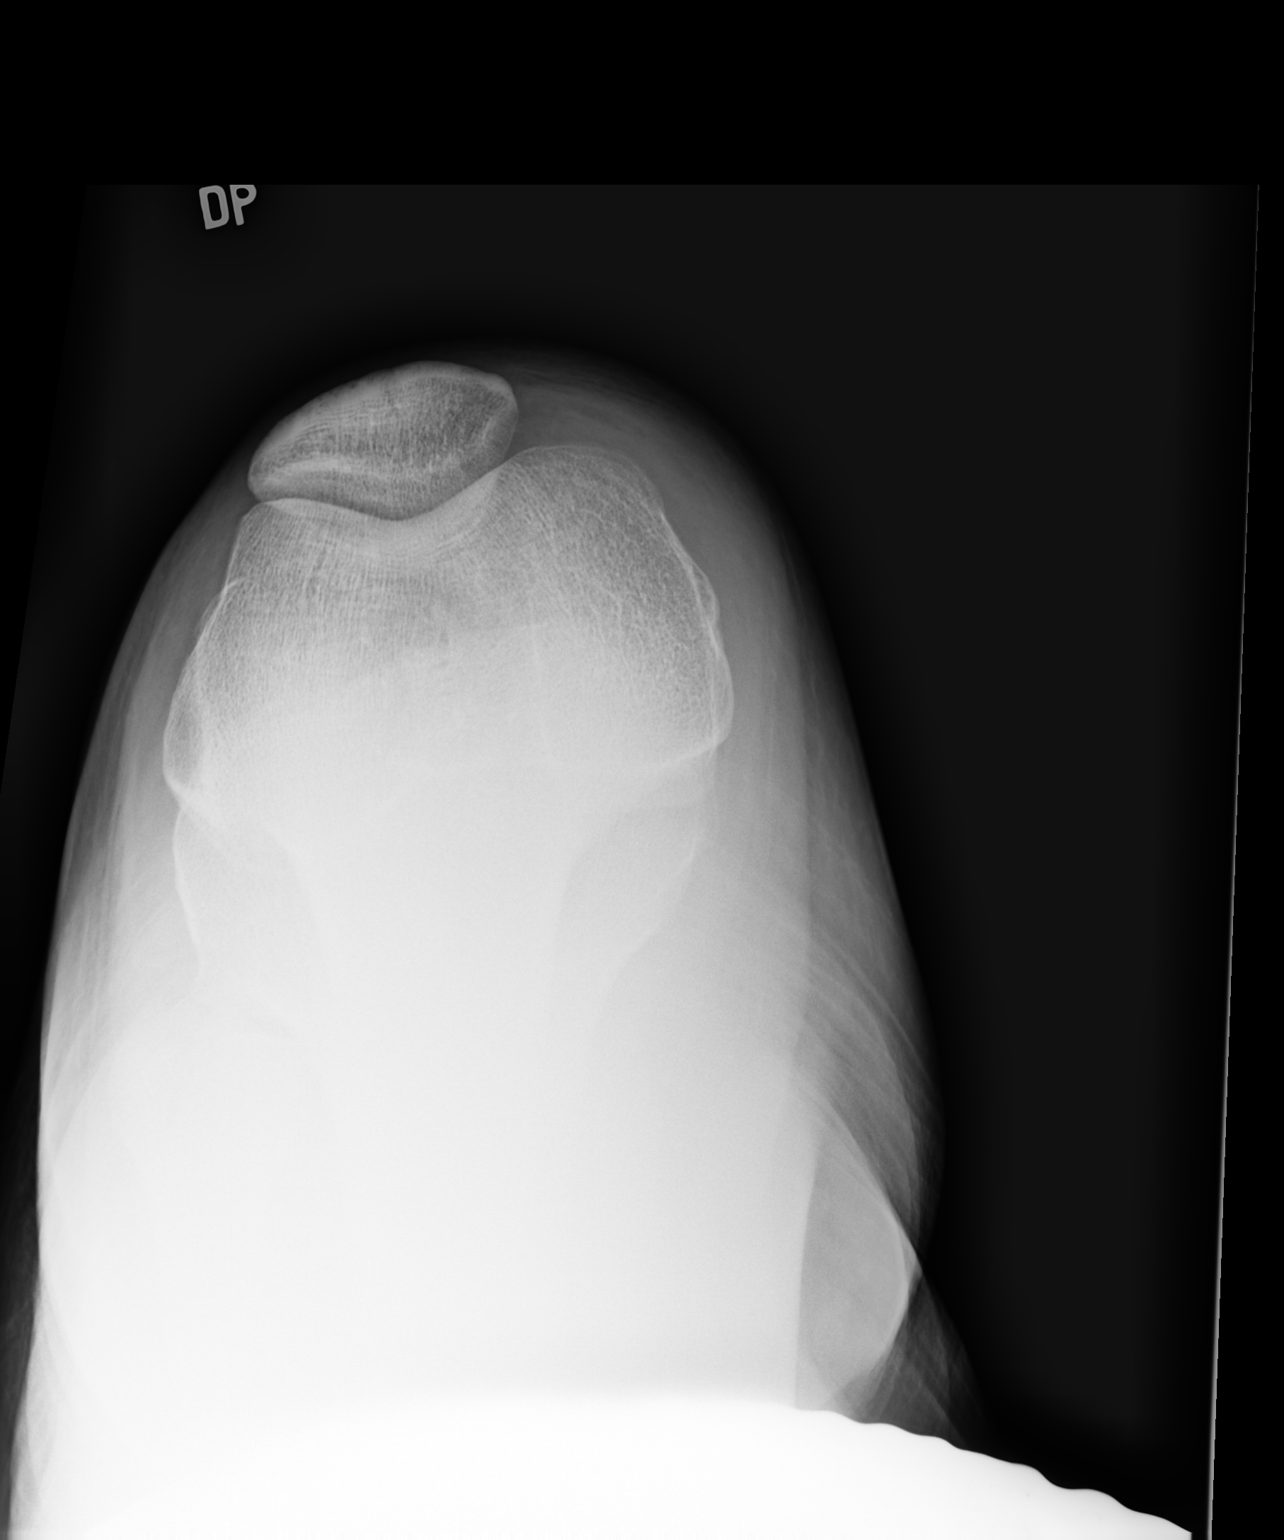

[3 of 3 positions shown; findings below may reference images not displayed]

FINDINGS: No fracture or bone lesion.

Knee joint is normally spaced and aligned.  No arthropathic changes.

Small joint effusion.

Soft tissues are unremarkable.
IMPRESSION: 1. No fracture bone lesion or arthropathic changes.
2. Small joint effusion.

## 2023-02-18 ENCOUNTER — Ambulatory Visit (HOSPITAL_COMMUNITY)
Admission: EM | Admit: 2023-02-18 | Discharge: 2023-02-18 | Disposition: A | Discharge status: Home / Self Care | Payer: Medicaid Other | Attending: Urgent Care | Admitting: Urgent Care

## 2023-02-18 ENCOUNTER — Ambulatory Visit: Admit: 2023-02-18 | Payer: Medicaid Other | Source: Home / Self Care

## 2023-02-18 ENCOUNTER — Encounter (HOSPITAL_COMMUNITY): Payer: Self-pay | Admitting: *Deleted

## 2023-02-18 DIAGNOSIS — Z202 Contact with and (suspected) exposure to infections with a predominantly sexual mode of transmission: Secondary | ICD-10-CM | POA: Diagnosis not present

## 2023-02-18 DIAGNOSIS — H1011 Acute atopic conjunctivitis, right eye: Secondary | ICD-10-CM

## 2023-02-18 MED ORDER — DOXYCYCLINE HYCLATE 100 MG PO CAPS: 100.0000 mg | ORAL_CAPSULE | Freq: Two times a day (BID) | ORAL | 0 refills | Status: AC

## 2023-02-18 MED ORDER — PAZEO 0.7 % OP SOLN: 1.0000 [drp] | Freq: Every day | OPHTHALMIC | 0 refills | Status: AC

## 2023-02-18 NOTE — Discharge Instructions (Addendum)
You were tested today for gonorrhea, chlamydia, trichomonas. Due to known exposure, we will treat you for chlamydia. Start taking doxycycline twice daily for the next 7 days. Use sun protection when outside. We will call you with the results of your test once received. Please avoid all forms of intercourse for 10 days; all partners will need to complete entire course of antibiotics prior to resuming. As always, practice safer sexual practices by using protection each and every time, and limiting number of partners.   Use one drop of the eye drop daily, as needed until symptoms resolve.

## 2023-02-18 NOTE — ED Provider Notes (Signed)
[facility]    CSN: [id number] Arrival date & time: 02/18/23  1915      History   Chief Complaint Chief Complaint  Patient presents with   SEXUALLY TRANSMITTED DISEASE    HPI Allen Raymond is a 23 y.o. male.   22 year old male presents today due to concerns of exposure to chlamydia.  States his girlfriend was recently diagnosed with chlamydia, he would like to be tested.  He is not having any active symptoms.  He is concerned however over the past 2 days his right eye has been red.  He denies any mucopurulent discharge, states it has been tearing.  He denies any blurry vision, fever, headache.  Temp generally tested right after patient drink a hot beverage.  Recheck temp normal.  Patient admits to history of allergies, has not taken anything for the symptoms. Denies hx of HTN or tachycardia, admits to being very anxious.     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 Authorizing Provider  doxycycline (VIBRAMYCIN) 100 MG capsule Take 1 capsule (100 mg total) by mouth 2 (two) times daily for 7 days. 02/18/23 02/25/23 Yes Gaines Cartmell L, PA  Olopatadine HCl (PAZEO) 0.7 % SOLN Apply 1 drop to eye daily. 02/18/23  Yes Chanika Byland, Jodelle Gross, PA    Family History Family History  Problem  Relation Age of Onset   Diabetes Maternal Grandmother     Social History Social History   Tobacco Use   Smoking status: Never   Smokeless tobacco: Never  Vaping Use   Vaping Use: Never used  Substance Use Topics   Alcohol use: Yes   Drug use: Yes    Types: Marijuana     Allergies   Patient has no known allergies.   Review of Systems Review of Systems As per HPI  Physical Exam Triage Vital Signs ED Triage Vitals  Enc Vitals Group     BP 02/18/23 1956 (!) 141/90     Pulse Rate 02/18/23 1956 (!) 108     Resp 02/18/23 1956 18     Temp 02/18/23 1956 100 F (37.8 C)     Temp Source 02/18/23 1956 Oral     SpO2 02/18/23 1956 97 %     Weight --      Height --      Head Circumference --      Peak Flow --      Pain Score 02/18/23 1955 0     Pain Loc --      Pain Edu? --      Excl. in GC? --    No data found.  Updated Vital Signs BP (!) 141/90 (BP Location: Right Arm)   Pulse (!) 108   Temp 100 F (37.8 C) (Oral)   Resp  18   SpO2 97%   Visual Acuity Right Eye Distance:   Left Eye Distance:   Bilateral Distance:    Right Eye Near:   Left Eye Near:    Bilateral Near:     Physical Exam Vitals and nursing note reviewed.  Constitutional:      General: He is not in acute distress.    Appearance: Normal appearance. He is normal weight. He is not ill-appearing, toxic-appearing or diaphoretic.  HENT:     Head: Normocephalic and atraumatic.     Right Ear: External ear normal.     Left Ear: External ear normal.     Nose: No congestion or rhinorrhea.     Mouth/Throat:     Mouth: Mucous membranes are moist.     Pharynx: Oropharynx is clear. No oropharyngeal exudate.  Eyes:     General: Vision grossly intact. Gaze aligned appropriately. No visual field deficit or scleral icterus.       Right eye: Discharge (clear, tearing) present. No foreign body or hordeolum.        Left eye: No foreign body, discharge or hordeolum.     Extraocular Movements: Extraocular  movements intact.     Conjunctiva/sclera:     Right eye: Right conjunctiva is injected. No chemosis, exudate or hemorrhage.    Pupils: Pupils are equal, round, and reactive to light.     Right eye: Pupil is round, reactive and not sluggish.     Left eye: Pupil is round, reactive and not sluggish.  Neurological:     Mental Status: He is alert.      UC Treatments / Results  Labs (all labs ordered are listed, but only abnormal results are displayed) Labs Reviewed  CYTOLOGY, (ORAL, ANAL, URETHRAL) ANCILLARY ONLY    EKG   Radiology No results found.  Procedures Procedures (including critical care time)  Medications Ordered in UC Medications - No data to display  Initial Impression / Assessment and Plan / UC Course  I have reviewed the triage vital signs and the nursing notes.  Pertinent labs & imaging results that were available during my care of the patient were reviewed by me and considered in my medical decision making (see chart for details).  Clinical Course as of 02/18/23 2041  Thu Feb 18, 2023  2015 Recheck temp 98.5 Recheck pulse 100 [WC]    Clinical Course User Index [WC] [place], Dimitri Dsouza L, [place]    Exposure to chlamydia - aptima swab collected. Will initiate doxy BID x 7 days.  Allergic conjunctivitis - will start pazeo topical drops. Does not look consistent with ocular chlamydia. Pt to also start OTC antihistamine such as xyzal or Zyrtec.   Final Clinical Impressions(s) / UC Diagnoses   Final diagnoses:  Exposure to chlamydia  Allergic conjunctivitis of right eye     Discharge Instructions      You were tested today for gonorrhea, chlamydia, trichomonas. Due to known exposure, we will treat you for chlamydia. Start taking doxycycline twice daily for the next 7 days. Use sun protection when outside. We will call you with the results of your test once received. Please avoid all forms of intercourse for 10 days; all partners will need to complete entire  course of antibiotics prior to resuming. As always, practice safer sexual practices by using protection each and every time, and limiting number of partners.   Use one drop of the eye drop daily, as needed until symptoms resolve.    ED Prescriptions  Medication Sig Dispense Auth. Provider   doxycycline (VIBRAMYCIN) 100 MG capsule Take 1 capsule (100 mg total) by mouth 2 (two) times daily for 7 days. 14 capsule Zerick Prevette L, PA   Olopatadine HCl (PAZEO) 0.7 % SOLN Apply 1 drop to eye daily. 2.5 mL Verlon Carcione L, PA      PDMP not reviewed this encounter.   Maretta Bees, [place] 02/18/23 2043

## 2023-02-18 NOTE — ED Triage Notes (Signed)
Pt states that he would like STI testing. He states his GF ex tested positive for chlamydia but it was a while ago and he denies any sx.   He states he also has watery eyes.

## 2023-02-19 LAB — CYTOLOGY, (ORAL, ANAL, URETHRAL) ANCILLARY ONLY
Chlamydia: NEGATIVE
Comment: NEGATIVE
Comment: NEGATIVE
Comment: NORMAL
Neisseria Gonorrhea: NEGATIVE
Trichomonas: NEGATIVE
